# Patient Record
Sex: Male | Born: 1990 | Race: White | Hispanic: No | Marital: Single | Smoking: Current every day smoker
Health system: Southern US, Community
[De-identification: ages and names within clinical notes are randomized; demographics above are authoritative.]

---
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
## 2014-12-17 ENCOUNTER — Emergency Department (HOSPITAL_COMMUNITY): Payer: Self-pay

## 2014-12-17 ENCOUNTER — Encounter (HOSPITAL_COMMUNITY): Payer: Self-pay | Admitting: *Deleted

## 2014-12-17 ENCOUNTER — Emergency Department (HOSPITAL_COMMUNITY)
Admission: EM | Admit: 2014-12-17 | Discharge: 2014-12-17 | Discharge status: Against Medical Advice | Payer: Self-pay | Attending: Emergency Medicine | Admitting: Emergency Medicine

## 2014-12-17 DIAGNOSIS — S43015A Anterior dislocation of left humerus, initial encounter: Secondary | ICD-10-CM | POA: Insufficient documentation

## 2014-12-17 DIAGNOSIS — Y939 Activity, unspecified: Secondary | ICD-10-CM | POA: Insufficient documentation

## 2014-12-17 DIAGNOSIS — Y998 Other external cause status: Secondary | ICD-10-CM | POA: Insufficient documentation

## 2014-12-17 DIAGNOSIS — Z720 Tobacco use: Secondary | ICD-10-CM | POA: Insufficient documentation

## 2014-12-17 DIAGNOSIS — Y929 Unspecified place or not applicable: Secondary | ICD-10-CM | POA: Insufficient documentation

## 2014-12-17 DIAGNOSIS — X58XXXA Exposure to other specified factors, initial encounter: Secondary | ICD-10-CM | POA: Insufficient documentation

## 2014-12-17 MED ORDER — LIDOCAINE HCL (PF) 1 % IJ SOLN
20.0000 mL | Freq: Once | INTRAMUSCULAR | Status: DC
  Filled 2014-12-17: qty 20

## 2014-12-17 NOTE — ED Provider Notes (Signed)
CSN: [id number]     Arrival date & time 12/17/14  1440 History  This chart was scribed for non-physician practitioner, Lottie Mussel, PA-C, working with Melene Plan, DO, by Ronney Lion, ED Scribe. This patient was seen in room WTR9/WTR9 and the patient's care was started at 4:53 PM.    Chief Complaint  Patient presents with  . Shoulder Dislocation    The history is provided by the patient. No language interpreter was used.    HPI Comments: Troy Myers is a 24 y.o. male who presents to the Emergency Department complaining of an anterior left shoulder dislocation that occurred when patient was doing arm curls with 10-lb weights today, about 3 hours ago. He states he has normally been able to pop it back into place himself in the past but has been unable to do so this time. He states this has happened occasionally in the past, with the last episode occuring several years ago. States last few times he had to be sedated to reduce. He states when he used to play hockey, it would occur once every 3-4 weeks due to some movement or contact injury. Patient states he has not seen an orthopedist for this. Patient states he has been lifting weights about 5 days a week for the past month. Patient denies a history of any chronic medical conditions.  History reviewed. No pertinent past medical history. History reviewed. No pertinent past surgical history. No family history on file. History  Substance Use Topics  . Smoking status: Current Every Day Smoker  . Smokeless tobacco: Not on file  . Alcohol Use: Yes    Review of Systems  Constitutional: Negative for fever and chills.  Musculoskeletal: Positive for arthralgias (right shoulder pain).  Neurological: Negative for weakness and numbness.   Allergies  Review of patient's allergies indicates not on file.  Home Medications   Prior to Admission medications   Medication Sig Start Date End Date Taking? Authorizing Provider  acetaminophen (TYLENOL)  325 MG tablet Take 650 mg by mouth every 6 (six) hours as needed for moderate pain.   Yes Historical Provider, MD  ibuprofen (ADVIL) 200 MG tablet Take 400 mg by mouth every 6 (six) hours as needed for moderate pain.   Yes Historical Provider, MD  PROTEIN PO Take 2 scoop by mouth daily as needed (gym).   Yes Historical Provider, MD  Tetrahydrozoline HCl (VISINE OP) Apply 1-2 drops to eye daily as needed (red eye).   Yes Historical Provider, MD   BP 114/65 mmHg  Pulse 94  Temp(Src) 98.1 F (36.7 C) (Oral)  Resp 18  SpO2 98% Physical Exam  Constitutional: He is oriented to person, place, and time. He appears well-developed and well-nourished. No distress.  HENT:  Head: Normocephalic and atraumatic.  Eyes: Conjunctivae and EOM are normal.  Neck: Neck supple. No tracheal deviation present.  Cardiovascular: Normal rate.   Pulmonary/Chest: Effort normal. No respiratory distress.  Musculoskeletal: Normal range of motion.  Left shoulder deformity noted, with humerus shifted anteriorly, patient holding his hand at 90 abduction. Distal radial pulses intact. Sensation intact in all dermatomes.  Neurological: He is alert and oriented to person, place, and time.  Skin: Skin is warm and dry.  Psychiatric: He has a normal mood and affect. His behavior is normal.  Nursing note and vitals reviewed.   ED Course  Procedures (including critical care time)  DIAGNOSTIC STUDIES: Oxygen Saturation is 98% on RA, normal by my interpretation.    COORDINATION OF  CARE: 5:01 PM - Discussed treatment plan with pt at bedside which includes lidocaine injection and reduction of left shoulder, and pt agreed to plan.   5:11 PM - 10 mL of lidocaine injected.  5:17 PM - Shoulder successfully reduced by me. Discussed post-reduction treatment, including placement in a sling and f/u with orthopedist. Patient strongly encouraged to see an orthopedist for his history of recurrent shoulder dislocations. Will give work  note. Pt verbalized understanding and agreed to plan.   Imaging Review Dg Shoulder Left  12/17/2014   CLINICAL DATA:  Left shoulder pain/ deformity  EXAM: LEFT SHOULDER - 2+ VIEW  COMPARISON:  None.  FINDINGS: No fracture is seen.  Anterior shoulder dislocation.  The visualized soft tissues are unremarkable.  Visualized left lung is clear.  IMPRESSION: Anterior shoulder dislocation.  No fracture is seen.   Electronically Signed   By: Charline Bills M.D.   On: 12/17/2014 15:32   Reduction of dislocation Date/Time: 7:03 PM Performed by: Lottie Mussel Authorized by: Jaynie Crumble A Consent: Verbal consent obtained. Risks and benefits: risks, benefits and alternatives were discussed Consent given by: patient Required items: required blood products, implants, devices, and special equipment available Time out: Immediately prior to procedure a "time out" was called to verify the correct patient, procedure, equipment, support staff and site/side marked as required.  Patient sedated: No sedation  Vitals: Vital signs were monitored during sedation. Patient tolerance: Patient tolerated the procedure well with no immediate complications. Joint: left shoulder, anterior dislocation Reduction technique: external rotation and abduction  NERVE BLOCK Performed by: Jaynie Crumble A Consent: Verbal consent obtained. Required items: required blood products, implants, devices, and special equipment available Time out: Immediately prior to procedure a "time out" was called to verify the correct patient, procedure, equipment, support staff and site/side marked as required.  Indication: left shoulder dislocation Nerve block body site: left shoulder  Preparation: Patient was prepped and draped in the usual sterile fashion. Needle gauge: 25 G Location technique: anatomical landmarks  Local anesthetic: lidocaine 1%  Anesthetic total: 10 ml  Outcome: pain improved Patient tolerance:  Patient tolerated the procedure well with no immediate complications.      MDM   Final diagnoses:  Anterior shoulder dislocation, left, initial encounter   Patient with left shoulder anterior dislocation by x-ray. History of the same. Has not followed up with orthopedics tach recently. Does not want surgical repair in the future.Neurovascular intact.  Joint injected with lidocaine for anesthesia. Slow stead external rotation and abduction technique used for reduction which was successful. Repeat x-rays post reduction ordered. However patient eloped without informing staff. He was placed in a sling prior to eloping  Filed Vitals:   12/17/14 1447  BP: 114/65  Pulse: 94  Temp: 98.1 F (36.7 C)  TempSrc: Oral  Resp: 18  SpO2: 98%   I personally performed the services described in this documentation, which was scribed in my presence. The recorded information has been reviewed and is accurate.   Jaynie Crumble, PA-C 12/17/14 1907  Jaynie Crumble, PA-C 12/17/14 1907  Melene Plan, DO 12/17/14 2249

## 2014-12-17 NOTE — ED Notes (Signed)
Pt states he feels like his left shoulder dislocated while he was doing arm curls today around 2PM. Pt states his left shoulder has dislocated in the past, which he has been able to relocate but was unable to do it himself this time.

## 2014-12-17 NOTE — ED Notes (Signed)
Pt left AMA prior to receiving x-ray to confirm reduction of shoulder.

## 2017-01-23 IMAGING — CR DG SHOULDER 2+V*L*
3 series · 3 of 3 positions shown · non-contrast
Comparison: None.

CLINICAL DATA: Left shoulder pain/ deformity

EXAM:
LEFT SHOULDER - 2+ VIEW

[w shoulder external left]
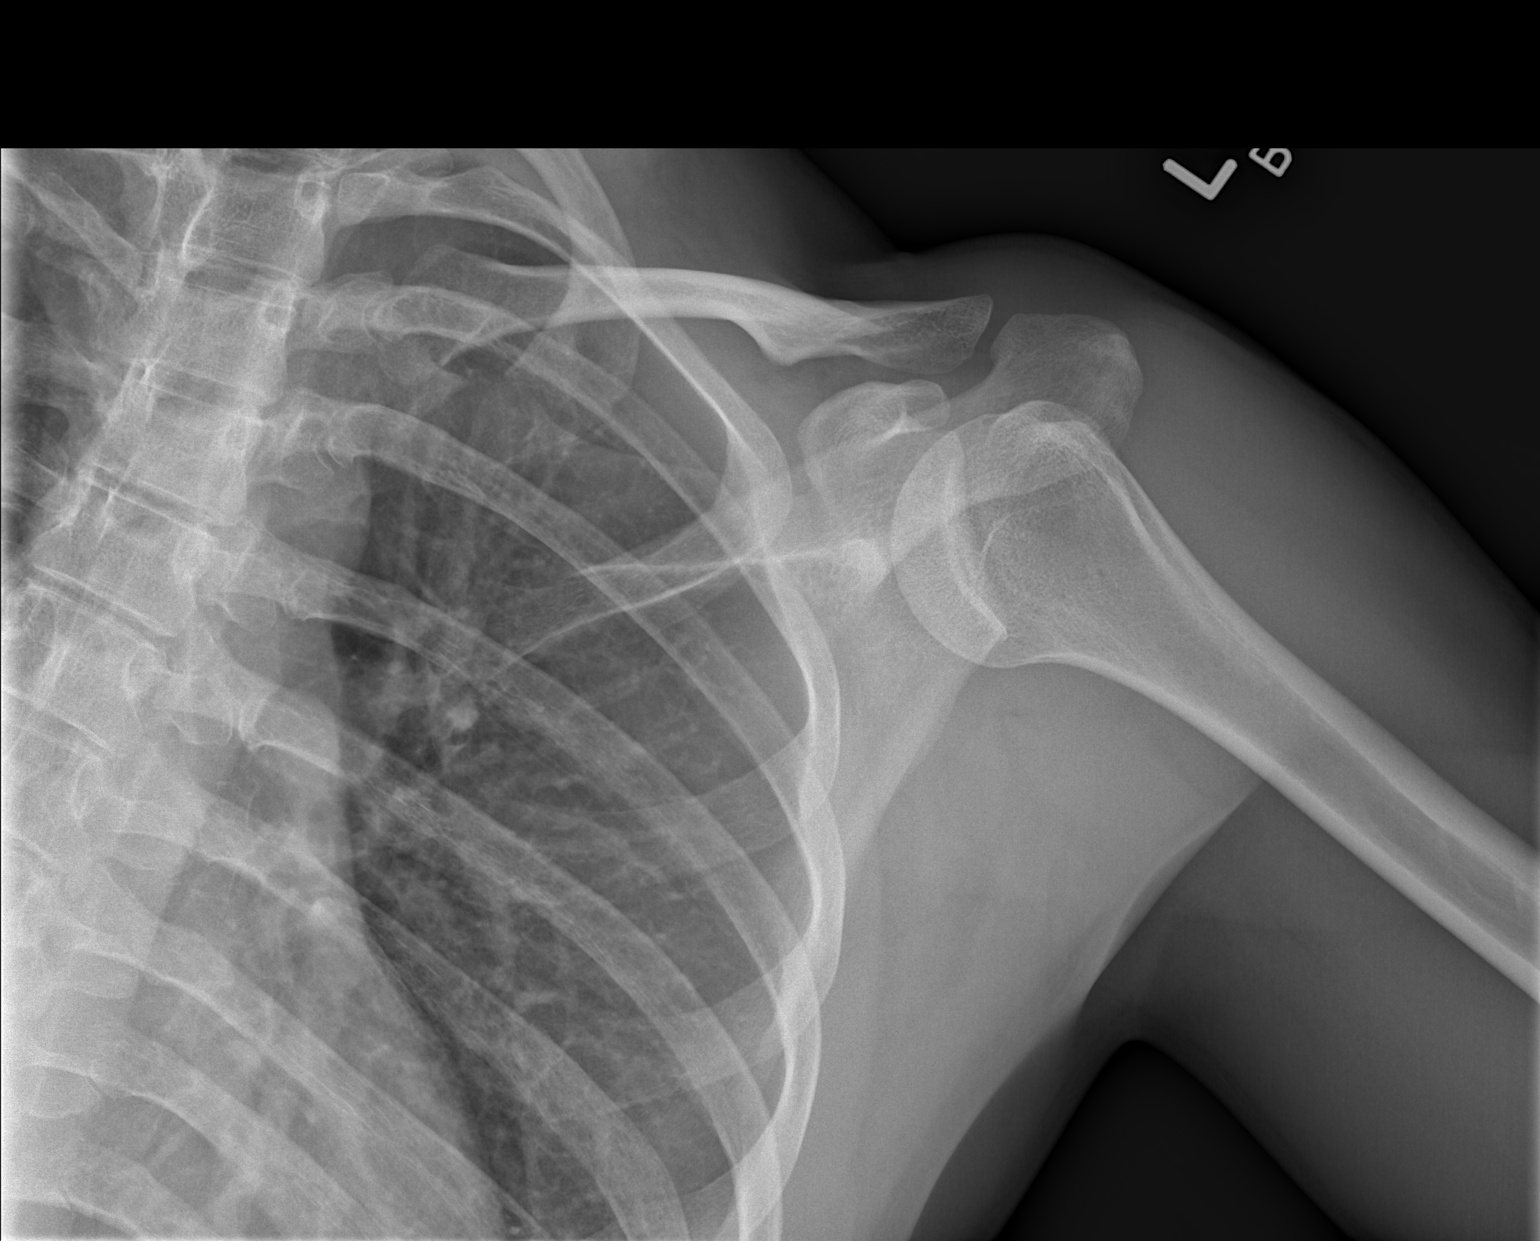

[w shoulder y-view left]
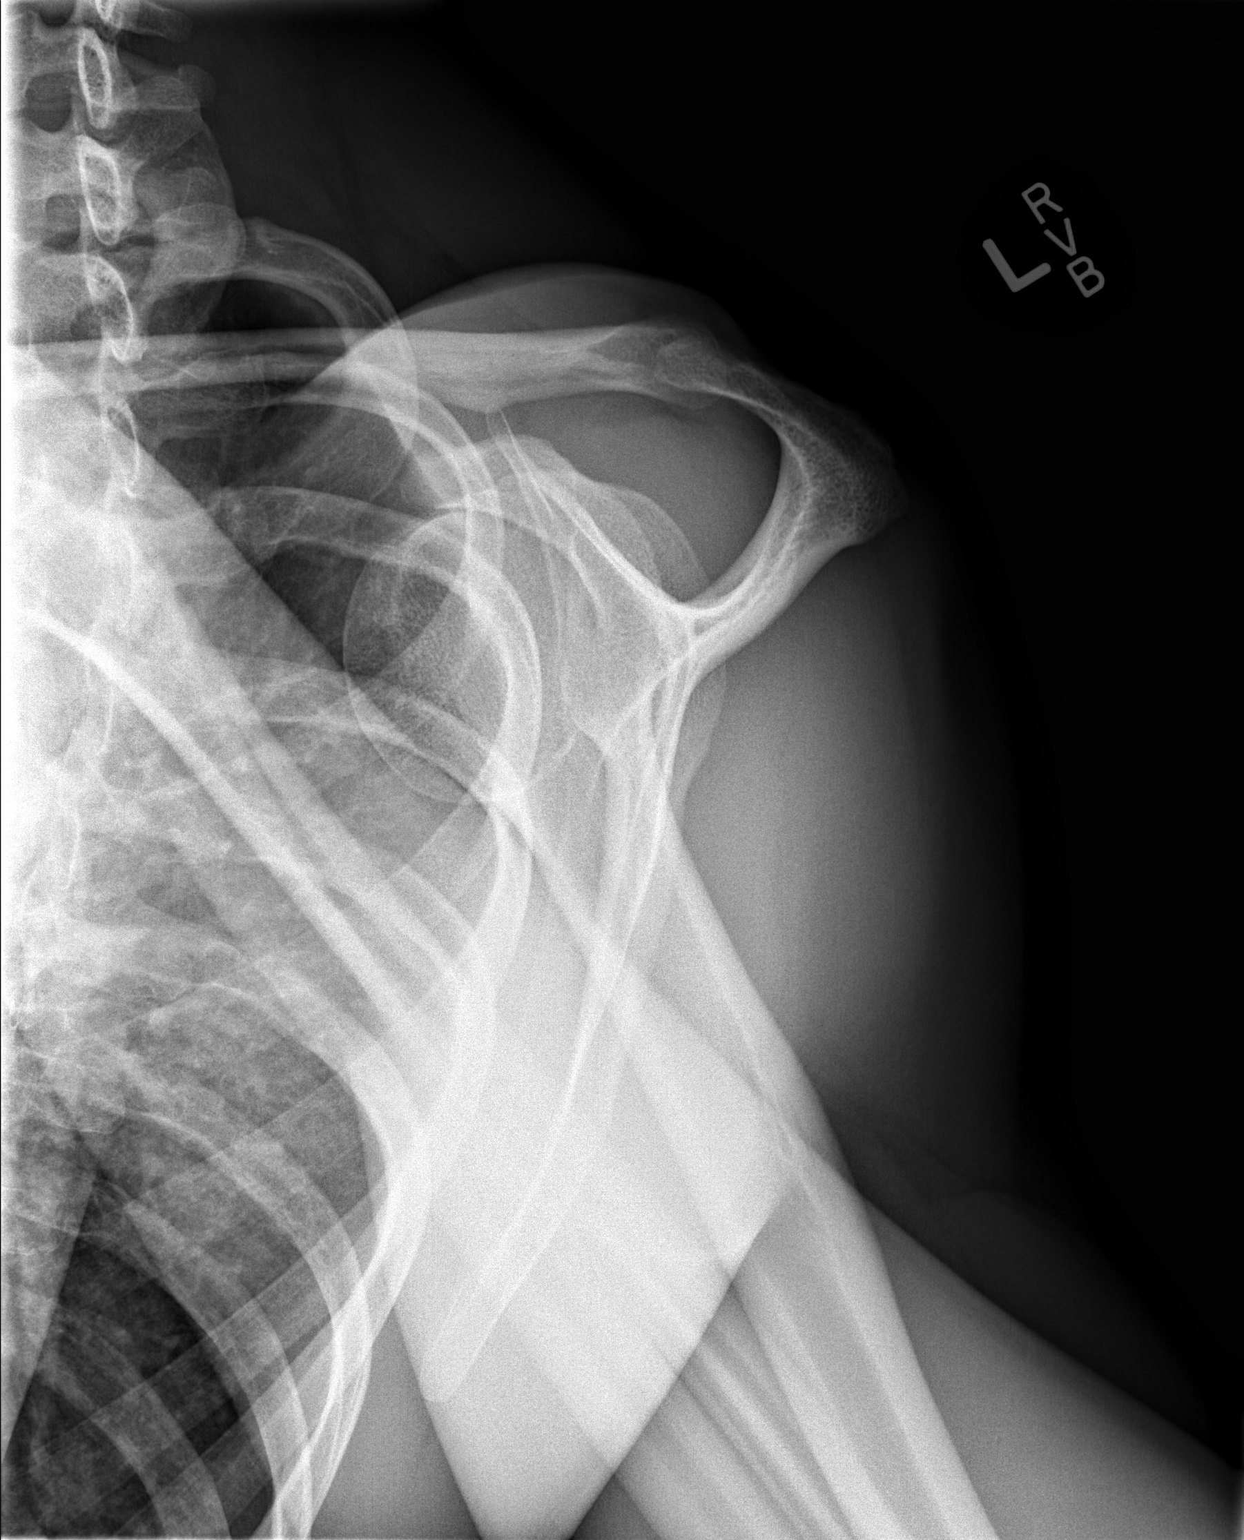

[x shoulder axillary left]
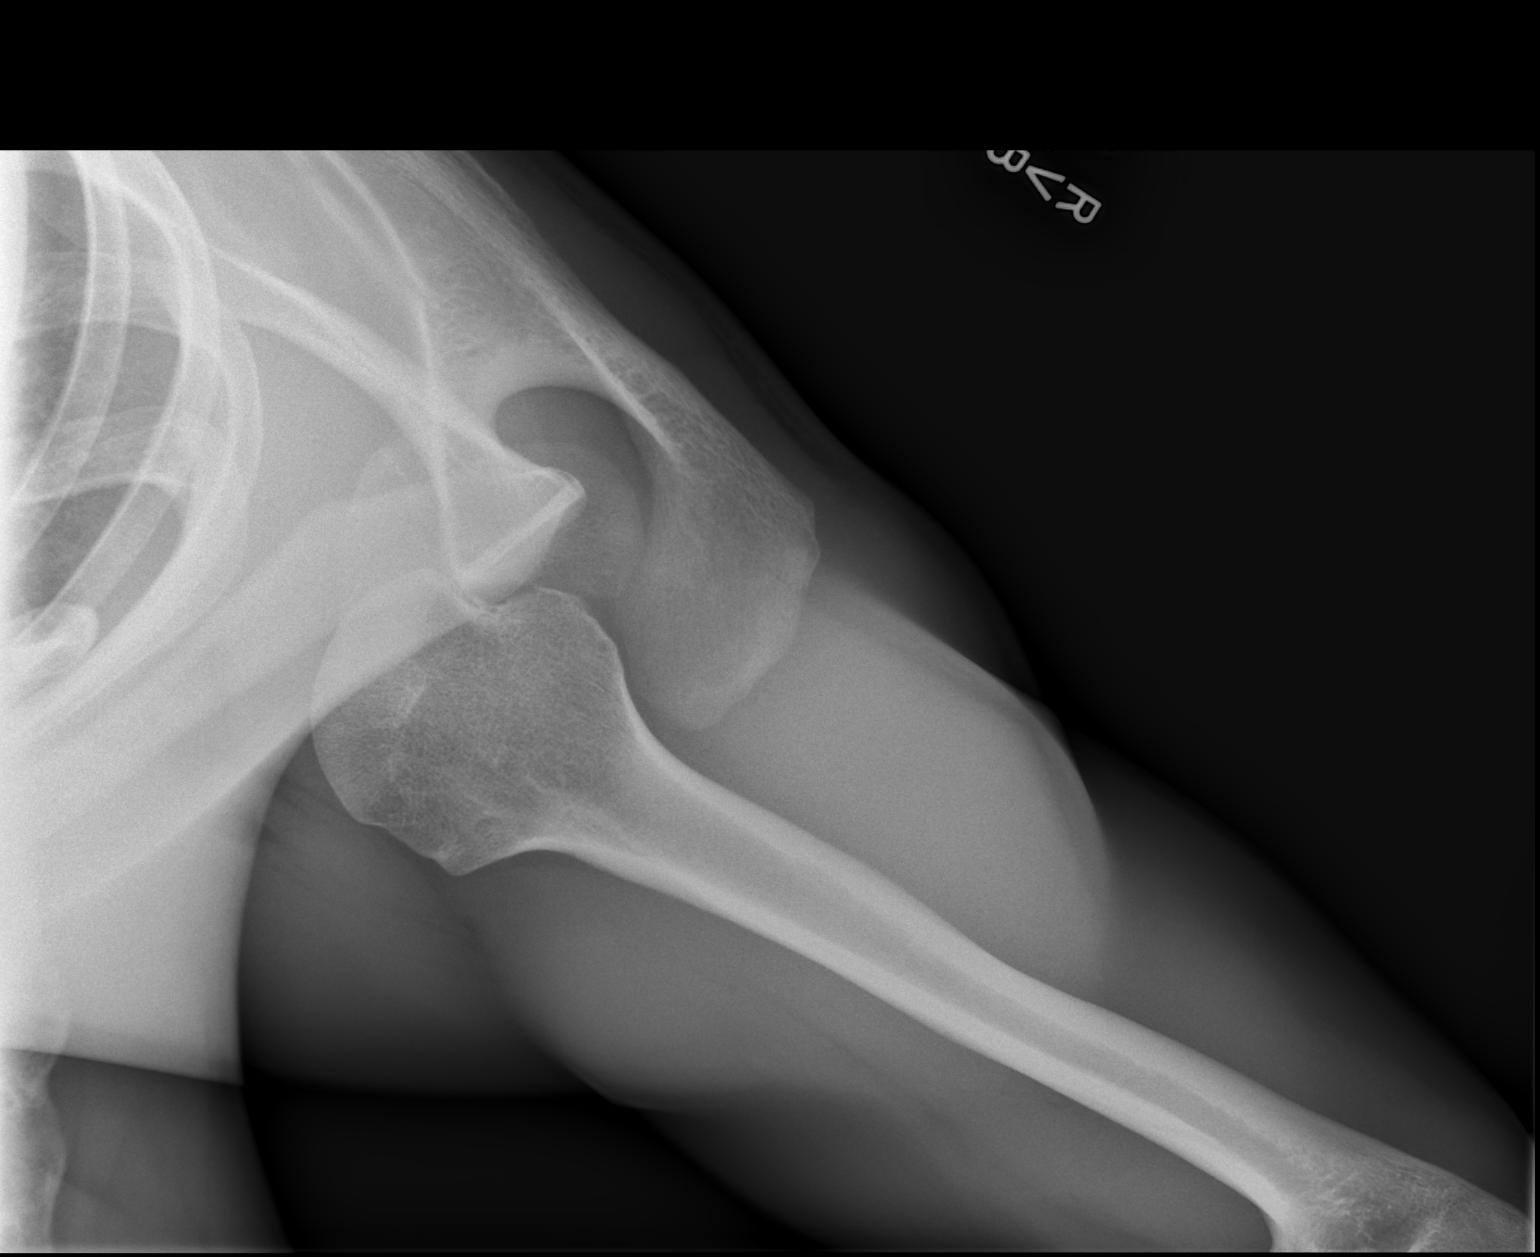

[3 of 3 positions shown; findings below may reference images not displayed]

FINDINGS: No fracture is seen.

Anterior shoulder dislocation.

The visualized soft tissues are unremarkable.

Visualized left lung is clear.
IMPRESSION: Anterior shoulder dislocation.

No fracture is seen.
